# Patient Record
Sex: Male | Born: 1940 | Race: White | Hispanic: No | Marital: Married | Smoking: Never smoker
Health system: Southern US, Community
[De-identification: ages and names within clinical notes are randomized; demographics above are authoritative.]

## PROBLEM LIST (undated history)

## (undated) DIAGNOSIS — E039 Hypothyroidism, unspecified: Secondary | ICD-10-CM

## (undated) DIAGNOSIS — N289 Disorder of kidney and ureter, unspecified: Secondary | ICD-10-CM

## (undated) DIAGNOSIS — C8590 Non-Hodgkin lymphoma, unspecified, unspecified site: Secondary | ICD-10-CM

## (undated) DIAGNOSIS — K222 Esophageal obstruction: Secondary | ICD-10-CM

## (undated) DIAGNOSIS — E876 Hypokalemia: Secondary | ICD-10-CM

## (undated) DIAGNOSIS — E559 Vitamin D deficiency, unspecified: Secondary | ICD-10-CM

## (undated) DIAGNOSIS — H9190 Unspecified hearing loss, unspecified ear: Secondary | ICD-10-CM

## (undated) DIAGNOSIS — C61 Malignant neoplasm of prostate: Secondary | ICD-10-CM

## (undated) DIAGNOSIS — G629 Polyneuropathy, unspecified: Secondary | ICD-10-CM

## (undated) DIAGNOSIS — L0390 Cellulitis, unspecified: Secondary | ICD-10-CM

## (undated) DIAGNOSIS — R7989 Other specified abnormal findings of blood chemistry: Secondary | ICD-10-CM

## (undated) DIAGNOSIS — E785 Hyperlipidemia, unspecified: Secondary | ICD-10-CM

## (undated) DIAGNOSIS — E2740 Unspecified adrenocortical insufficiency: Secondary | ICD-10-CM

## (undated) DIAGNOSIS — M109 Gout, unspecified: Secondary | ICD-10-CM

## (undated) DIAGNOSIS — C9110 Chronic lymphocytic leukemia of B-cell type not having achieved remission: Secondary | ICD-10-CM

## (undated) DIAGNOSIS — I959 Hypotension, unspecified: Secondary | ICD-10-CM

## (undated) DIAGNOSIS — K8020 Calculus of gallbladder without cholecystitis without obstruction: Secondary | ICD-10-CM

## (undated) DIAGNOSIS — K279 Peptic ulcer, site unspecified, unspecified as acute or chronic, without hemorrhage or perforation: Secondary | ICD-10-CM

## (undated) HISTORY — DX: Peptic ulcer, site unspecified, unspecified as acute or chronic, without hemorrhage or perforation: K27.9

## (undated) HISTORY — DX: Hypotension, unspecified: I95.9

## (undated) HISTORY — DX: Non-Hodgkin lymphoma, unspecified, unspecified site: C85.90

## (undated) HISTORY — DX: Cellulitis, unspecified: L03.90

## (undated) HISTORY — PX: HERNIA REPAIR: SHX51

## (undated) HISTORY — DX: Calculus of gallbladder without cholecystitis without obstruction: K80.20

## (undated) HISTORY — DX: Hypothyroidism, unspecified: E03.9

## (undated) HISTORY — DX: Polyneuropathy, unspecified: G62.9

## (undated) HISTORY — DX: Chronic lymphocytic leukemia of B-cell type not having achieved remission: C91.10

## (undated) HISTORY — DX: Unspecified adrenocortical insufficiency: E27.40

## (undated) HISTORY — DX: Other specified abnormal findings of blood chemistry: R79.89

## (undated) HISTORY — DX: Esophageal obstruction: K22.2

## (undated) HISTORY — DX: Disorder of kidney and ureter, unspecified: N28.9

## (undated) HISTORY — DX: Vitamin D deficiency, unspecified: E55.9

## (undated) HISTORY — DX: Hypokalemia: E87.6

## (undated) HISTORY — DX: Hyperlipidemia, unspecified: E78.5

## (undated) HISTORY — DX: Gout, unspecified: M10.9

## (undated) HISTORY — DX: Malignant neoplasm of prostate: C61

## (undated) HISTORY — DX: Unspecified hearing loss, unspecified ear: H91.90

---

## 1999-08-04 ENCOUNTER — Other Ambulatory Visit: Admission: RE | Admit: 1999-08-04 | Discharge: 1999-08-04 | Payer: Self-pay | Admitting: Oncology

## 2001-12-13 ENCOUNTER — Encounter: Payer: Self-pay | Admitting: Oncology

## 2001-12-13 ENCOUNTER — Ambulatory Visit (HOSPITAL_COMMUNITY): Admission: RE | Admit: 2001-12-13 | Discharge: 2001-12-13 | Payer: Self-pay | Admitting: Oncology

## 2002-01-09 ENCOUNTER — Encounter (INDEPENDENT_AMBULATORY_CARE_PROVIDER_SITE_OTHER): Payer: Self-pay | Admitting: Specialist

## 2002-01-09 ENCOUNTER — Ambulatory Visit (HOSPITAL_BASED_OUTPATIENT_CLINIC_OR_DEPARTMENT_OTHER): Admission: RE | Admit: 2002-01-09 | Discharge: 2002-01-09 | Payer: Self-pay | Admitting: General Surgery

## 2002-01-09 ENCOUNTER — Encounter (INDEPENDENT_AMBULATORY_CARE_PROVIDER_SITE_OTHER): Payer: Self-pay

## 2002-01-11 ENCOUNTER — Inpatient Hospital Stay (HOSPITAL_COMMUNITY): Admission: RE | Admit: 2002-01-11 | Discharge: 2002-01-14 | Payer: Self-pay | Admitting: Oncology

## 2002-04-15 ENCOUNTER — Inpatient Hospital Stay (HOSPITAL_COMMUNITY): Admission: EM | Admit: 2002-04-15 | Discharge: 2002-04-18 | Payer: Self-pay | Admitting: Oncology

## 2002-04-15 ENCOUNTER — Encounter: Payer: Self-pay | Admitting: Oncology

## 2002-04-24 ENCOUNTER — Encounter (HOSPITAL_COMMUNITY): Admission: RE | Admit: 2002-04-24 | Discharge: 2002-04-24 | Payer: Self-pay | Admitting: Oncology

## 2002-07-04 ENCOUNTER — Inpatient Hospital Stay (HOSPITAL_COMMUNITY): Admission: EM | Admit: 2002-07-04 | Discharge: 2002-07-07 | Payer: Self-pay | Admitting: Hematology and Oncology

## 2002-07-04 ENCOUNTER — Encounter: Payer: Self-pay | Admitting: Hematology and Oncology

## 2002-07-09 ENCOUNTER — Inpatient Hospital Stay (HOSPITAL_COMMUNITY): Admission: EM | Admit: 2002-07-09 | Discharge: 2002-07-14 | Payer: Self-pay | Admitting: Emergency Medicine

## 2002-07-10 ENCOUNTER — Encounter: Payer: Self-pay | Admitting: Cardiology

## 2002-07-16 ENCOUNTER — Inpatient Hospital Stay (HOSPITAL_COMMUNITY): Admission: EM | Admit: 2002-07-16 | Discharge: 2002-07-24 | Payer: Self-pay | Admitting: Oncology

## 2002-07-17 ENCOUNTER — Encounter: Payer: Self-pay | Admitting: Oncology

## 2002-07-17 ENCOUNTER — Encounter (INDEPENDENT_AMBULATORY_CARE_PROVIDER_SITE_OTHER): Payer: Self-pay | Admitting: Specialist

## 2002-07-18 ENCOUNTER — Encounter: Payer: Self-pay | Admitting: Hematology and Oncology

## 2002-07-20 ENCOUNTER — Encounter: Payer: Self-pay | Admitting: Oncology

## 2003-05-27 ENCOUNTER — Encounter (INDEPENDENT_AMBULATORY_CARE_PROVIDER_SITE_OTHER): Payer: Self-pay | Admitting: *Deleted

## 2003-05-27 ENCOUNTER — Other Ambulatory Visit: Admission: RE | Admit: 2003-05-27 | Discharge: 2003-05-27 | Payer: Self-pay | Admitting: Oncology

## 2003-05-28 ENCOUNTER — Other Ambulatory Visit: Admission: RE | Admit: 2003-05-28 | Discharge: 2003-05-28 | Payer: Self-pay | Admitting: Oncology

## 2004-01-07 ENCOUNTER — Other Ambulatory Visit: Admission: RE | Admit: 2004-01-07 | Discharge: 2004-01-07 | Payer: Self-pay | Admitting: *Deleted

## 2004-08-09 ENCOUNTER — Ambulatory Visit: Payer: Self-pay | Admitting: Oncology

## 2004-09-30 ENCOUNTER — Ambulatory Visit: Payer: Self-pay | Admitting: Oncology

## 2004-12-20 ENCOUNTER — Ambulatory Visit: Payer: Self-pay | Admitting: Oncology

## 2004-12-21 ENCOUNTER — Other Ambulatory Visit: Admission: RE | Admit: 2004-12-21 | Discharge: 2004-12-21 | Payer: Self-pay | Admitting: Oncology

## 2005-02-14 ENCOUNTER — Ambulatory Visit: Payer: Self-pay | Admitting: Oncology

## 2005-04-12 ENCOUNTER — Ambulatory Visit: Payer: Self-pay | Admitting: Oncology

## 2005-08-04 ENCOUNTER — Ambulatory Visit: Payer: Self-pay | Admitting: Oncology

## 2006-01-17 ENCOUNTER — Ambulatory Visit: Payer: Self-pay | Admitting: Oncology

## 2006-02-22 ENCOUNTER — Ambulatory Visit: Payer: Self-pay | Admitting: Oncology

## 2006-02-27 ENCOUNTER — Ambulatory Visit (HOSPITAL_COMMUNITY): Admission: RE | Admit: 2006-02-27 | Discharge: 2006-02-27 | Payer: Self-pay | Admitting: Oncology

## 2006-08-16 ENCOUNTER — Ambulatory Visit: Payer: Self-pay | Admitting: Oncology

## 2007-01-31 ENCOUNTER — Ambulatory Visit: Payer: Self-pay | Admitting: Oncology

## 2007-03-04 ENCOUNTER — Ambulatory Visit (HOSPITAL_COMMUNITY): Admission: RE | Admit: 2007-03-04 | Discharge: 2007-03-04 | Payer: Self-pay | Admitting: Oncology

## 2008-08-09 IMAGING — PT NM PET TUM IMG SKULL BASE T - THIGH
1 of 7 series · 1 of 25 positions shown · non-contrast
Comparison: Head CT 02/27/06.

CLINICAL DATA: Non-Hodgkin's lymphoma.  Status post stem cell transplant 05/28/02.
 FDG PET-CT TUMOR IMAGING (SKULL BASE TO THIGHS) ? 03/04/07: 
 Fasting Blood Glucose:  104.
TECHNIQUE: 16.1 mCi F18-FDG were administered via the left antecubital fossa.  Full ring PET imaging was performed from the skull base through the mid-thighs 60 minutes after injection.  CT data was obtained and used for attenuation correction and anatomic localization only.  (This was not acquired as a diagnostic CT examination.)

[Series 1: pet ac · axial · 3.3mm · 4.69mm/px · 1 of 267 slices shown]
[im 134/267]
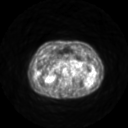

[1 of 25 positions shown; findings below may reference images not displayed]

FINDINGS: There is no hypermetabolic nodal activity in the neck, chest, abdomen, or pelvis.  The abdominal parenchymal organs appear normal.  Specifically, the spleen is normal in size and shows normal metabolic activity.  
 There is physiologic activity within the lymphoid tissue in Waldeyer's ring and within the salivary glands.  There is slightly heterogeneous activity within the thyroid gland with a new area of increased activity inferiorly on the right. This demonstrates a maximal SUV of 4.7.  There is no obvious correlative finding on noncontrast CT imaging.  
 The CT images demonstrate a calcified gallstone.  There is dependent atelectasis in both lungs.  No enlarged abdominal lymph nodes are identified.
IMPRESSION: 1.  No evidence for recurrent lymphoma.  Specifically, there is no hypermetabolic nodal activity or splenic pathology.
 2.  New mildly heterogeneous thyroid activity is nonspecific and possibly physiologic.  However, correlation with thyroid ultrasound is recommended.  If there is a focal abnormality in this area on ultrasound, fine needle aspiration may be warranted.

## 2011-02-03 NOTE — Discharge Summary (Signed)
NAMEJAZE, Todd Todd                          ACCOUNT NO.:  [id number]   MEDICAL RECORD NO.:  [id number]                   PATIENT TYPE:  INP   LOCATION:  0283                                 FACILITY:  [facility]   PHYSICIAN:  Leighton Roach. Truett Perna, M.D.              DATE OF BIRTH:  1940/10/07   DATE OF ADMISSION:  07/16/2002  DATE OF DISCHARGE:  07/24/2002                                 DISCHARGE SUMMARY   CONDITION ON DISCHARGE:  Improved.   DIAGNOSES:  1. Admission with symptomatic orthostatic hypotension, improved at     discharge.  2. Depression.     A. Started on Remeron/Effexor during this hospital admission, with        clinical improvement.  3. 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     A. Status post Cytoxan/total-body irradiation conditioning followed by        autologous stem cell infusion on May 28, 2002.     B. Restaging CT scans and bone marrow biopsy during this hospital        admission without evidence for progressive lymphoma.  4. Persistent anemia/severe thrombocytopenia.     A. Question secondary to bone marrow toxicity from chemotherapy/radiation        versus thrombotic thrombocytopenic purpura.     B. Trial of steroids started prior to discharge.  5. Oral candidiasis, maintained on Diflucan at discharge.  6. Coagulase-negative Staphylococcus, positive urine culture July 21, 2002, maintained on Macrobid at discharge.  7. Persistent hypokalemia, to continue on potassium supplementation.   CONSULTATIONS:  Dr. Jeanie Sewer, psychiatry.   PROCEDURES:  1. Transfusion with packed red blood cells.  2. Bone marrow biopsy.  3. CT scans of the chest, abdomen, and pelvis.   HOSPITAL COURSE:  The patient is a 70 year old with a history of high-grade  non-Hodgkin's lymphoma that evolved from chronic lymphocytic leukemia.   He underwent high-dose Cytoxan/TBI conditioning prior to autologous stem  cell  support on May 28, 2002, at [place].  Since discharge from the [facility] he has been symptomatic with depression, anorexia, and  orthostatic hypotension.   He has undergone an extensive diagnostic workup for the orthostasis without  a specific diagnosis being made.  He is again admitted with hypotension  after having falls at home on July 16, 2002.   He was placed on intravenous hydration, and he was transfused with two units  of packed red cells, and his symptoms again improved during this hospital  admission.  He continued to have orthostatic blood pressure/pulse changes  throughout this admission, but he was ambulating without falls or dizziness  prior to discharge.   The patient was tearful and appeared depressed on admission.  A psychiatry  consult was obtained by Dr. 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  He recommended Remeron/Effexor,  and these were started  prior to discharge.  The patient's affect had improved significantly at the  time of discharge.   He had persistent severe anemia/thrombocytopenia during this admission.  A  repeat bone marrow aspirate and biopsy on July 17, 2002, ([id number])  revealed a normocellular marrow with slight megakaryocytic hypoplasia.  There was no evidence for lymphoma.   The LDH was at the upper end of normal, but there was no other evidence for  hemolysis.  A review of a peripheral blood smear on two occasions during  this admission did not reveal a significant number of schistocytes.  I  discussed the case by telephone on several occasions with Dr. Sherene Sires, and  we decided to begin a trial of steroids on July 21, 2002.  This will be  continued at discharge.  The platelet count was stable at approximately 10-  15,000 throughout this hospital admission.  He was transfused with two units  of packed red cells on July 18, 2002, when hemoglobin returned at 8.3.  Hemoglobin was measured at 9.5  with a platelet count of 10,000 on the day of  discharge.   He was treated with Diflucan for oral candidiasis, and this will be  continued at discharge.   He continued to receive potassium supplementation during this admission, and  this will also be continued at discharge.   Despite the negative hemolysis workup, I am concerned that he may have  posttransplant TTP.  Dr. Sherene Sires will evaluate him at [facility] on July 28, 2002, to consider the indication for phoresis.   On the morning of July 24, 2002, he appeared stable for discharge.   His appetite has improved significantly prior to discharge.   DISCHARGE MEDICATIONS:  1. Effexor 37.5 mg q.d.  2. Remeron 15 mg q.h.s.  3. Macrobid 1 b.i.d. for five days.  4. Ativan 0.5 mg prior to meals as needed for nausea.  5. Prednisone 60 mg q.d.  6. Diflucan 50 mg q.d. for four days.  7. Potassium chloride 20 mEq twice daily.    DISCHARGE INSTRUCTIONS:  Follow-up care will be at the [facility] on July 25, 2002, for a catheter flush.  He will call for fever  greater than 101 degrees, bleeding, or recurrent dizziness/falling episodes.  He will see Dr. Sherene Sires on July 28, 2002, at the [facility].  A follow-up appointment with Dr. Truett Perna will be arranged at the [facility].                                                Leighton Roach Truett Perna, M.D.    GBS/MEDQ  D:  07/24/2002  T:  07/24/2002  Job:  [id number]   cc:   Todd Todd, M.D.  B[facility] Trans[facility]  [facility]   Antonietta Breach, M.D.  [address]  [place], [place] [address]  Fax: [phone]
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
## 2011-02-03 NOTE — H&P (Signed)
Todd Todd, Todd Todd                          ACCOUNT NO.:  [id number]   MEDICAL RECORD NO.:  [id number]                   PATIENT TYPE:  INP   LOCATION:  0259                                 FACILITY:  [facility]   PHYSICIAN:  Lowell C. Catha Gosselin, M.D.               DATE OF BIRTH:  1941/07/13   DATE OF ADMISSION:  07/16/2002  DATE OF DISCHARGE:  07/14/2002                                HISTORY & PHYSICAL   CHIEF COMPLAINT:  Falls and hypotension.   HISTORY OF PRESENT ILLNESS:  Mr. Bennis is a 70 year old gentleman who was  diagnosed with CLL approximately two years ago. In the spring of 2003, he  developed increased adenopathy and leukocytosis. He was treated with Fludara  with progression in the lymphadenopathy and B symptoms. 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 Flow cytometry confirmed CD 20 positivity. He  was treated with three cycles of CHOP/Rituxan with near resolution of the  adenopathy. He was subsequently referred to [facility]  with recommendations for high dose chemotherapy followed by autologous stem  cell transplant. He was treated with Rituxan/DAHP in late July of 2003 at  [facility]. He was subsequently to [facility] on September 2 of 2003,  received high dose chemotherapy/TBI followed by stem cell infusion on  May 28, 2002. His course was complicated by persistent nausea,  vomiting and diarrhea. He was discharged on June 29, 2002 and seen by Dr.  Truett Perna on July 01, 2002 at the [facility]. At that time, he  was found to be hypotensive with symptomatic orthostasis. He has required  admission for hypotension and falls on two occasions (July 04, 2002 to  July 07, 2002 and July 10, 2002 to July 14, 2002).  Workup thus far  has been negative for the etiology of his hypotension including a negative  echo, negative neurological  evaluation and normal cortisol levels. He was  transfused 2 units of packed red blood cells during his last admission for a  hemoglobin of 7.6 and was started on a trial of Florinef. At the time of his  discharge on July 14, 2002, his blood pressure had stabilized and he was  ambulating without difficulty.   Mr. Boulay was seen at the [facility] on July 15, 2002 at  which time he was again found to be hypotensive felt to be secondary to  dehydration. He received a liter of IV fluids in the office. Today, Mr.  Flax presented to the office with complaints of falling at home and was  again found to be hypotensive. He was transferred to [facility]  for admission/further evaluation.   PAST MEDICAL HISTORY:  1. Non-Hodgkin lymphoma as above.  2. Orthostatic hypotension of unknown etiology.  3. Hypokalemia and hypomagnesemia.  4. Klebsiella UTI, October 2003.  5. Anemia and thrombocytopenia likely secondary to high dose therapy.  6. Depression.   CURRENT MEDICATIONS:  1. Cipro 500 mg twice daily.  2. Florinef 0.2 mg daily.  3. K-Dur 20 mEq daily.  4. Magnesium oxide 400 mg b.i.d.  5. Ativan 0.5 mg t.i.d.  6. Protonix 40 mg daily.   ALLERGIES:  No known drug allergies.   FAMILY HISTORY:  Noncontributory.   SOCIAL HISTORY:  Mr. Homeyer lives in [place]. He is married. He owns a  Electrical engineer business.   REVIEW OF SYMPTOMS:  The patient denies any fever or sweats. He reports  increasing weakness over the past two days. He denies any pain. His wife  reports that his oral intake has been fair. He has had no unusual headaches  or vision changes. He denies any bleeding. He has had no shortness of breath  or cough. He denies any change in his bowel habits. He has had no hematuria  or dysuria. He reports chronic right lower extremity weakness. He reports  numbness in his hands and feet.   PHYSICAL EXAMINATION:  VITAL SIGNS:  Temperature 97.5, heart rate 122,   respirations 20, blood pressure 89/54, oxygen saturation 99% on room air.  GENERAL:  Chronically ill appearing male, tearful at times, in no acute  distress.  HEENT:  Alopecia. Pupils equal round and reactive to light. __________  intact, sclera anicteric. Right eyelid ptosis. Right posterior palate with  small petechia.  LYMPH:  No cervical or supraclavicular adenopathy.  CHEST:  Breath sounds are diminished globally; clear. No wheezes or rales.  CARDIOVASCULAR:  S1, tachycardic.  ABDOMEN:  Soft, nontender. No hepatosplenomegaly.  EXTREMITIES:  No edema.  NEUROLOGIC:  Alert and oriented. Tearful at times. Motor strength is 5/5  except for slightly decreased in the right lower extremity. He ambulates  from the wheelchair to the bed without difficulty.   LABORATORY DATA:  Pending.   IMPRESSION AND PLAN:  Mr. Salvino is a 70 year old gentleman with a history of  non-Hodgkin lymphoma status post stem cell therapy a little over six weeks  ago now with his third admission for failure to thrive, hypotension and near  syncope. He has no B symptoms. MRI of the brain on July 04, 2002 was  negative; echocardiogram on July 10, 2002 was unremarkable. We feel that  the best thing we can do with his situation is to do a bone marrow exam on  July 17, 2002 with cytogenetics, flow cytometry and fungal stain/culture.  If the bone marrow exam is negative, we would consider proceeding with an LP  with platelets infusing under Fluoro guidance. If this is  negative, we would then obtain neurology and endocrinology consults. He has  not had a bone marrow exam since his transplant.   The patient was seen and examined by Dr. Catha Gosselin.     Lonna Cobb, N.P.                         Quintin Alto C. Catha Gosselin, M.D.    LT/MEDQ  D:  07/16/2002  T:  07/16/2002  Job:  [id number]   cc:   Jillyn Hidden B. Truett Perna, M.D.  [address] [facility]  [place]  [place]  [phone]  Fax: [phone]

## 2011-02-03 NOTE — Op Note (Signed)
[place]  Patient:    Todd Todd, Todd Todd Visit Number: [id number] MRN: [id number]          Service Type: DSU Location: [facility] Attending Physician:  Arlis Porta Dictated by:   Adolph Pollack, M.D. Proc. Date: 01/09/02 Admit Date:  01/09/2002   CC:         Jillyn Hidden B. Truett Perna, M.D.   Operative Report  PREOPERATIVE DIAGNOSIS:  Chronic lymphocytic leukemia with lymphadenopathy.  POSTOPERATIVE DIAGNOSIS:  Chronic lymphocytic leukemia with lymphadenopathy.  OPERATION PERFORMED:  Deep right axillary lymph node biopsy.  SURGEON:  Adolph Pollack, M.D.  ASSISTANT:  Yaakov Guthrie, PA student.  ANESTHESIA:  Local (1% lidocaine with epinephrine plus 0.5% plain Marcaine) with MAC.  INDICATIONS FOR PROCEDURE:  Mr. Hitchens is a 70 year old male with CLL and now progressive lymphadenopathy.  He presents for lymph node biopsy.  The procedure and the risks were explained to him preoperatively.  DESCRIPTION OF PROCEDURE:  The patient was brought to the operating table, placed supine on the table and given intravenous sedation.  The right axillary area was shaved and sterilely prepped and draped.  Local anesthetic was infiltrated in the right axillary area and a curvilinear incision was made at the inferior aspect of the hairline and carried through the subcutaneous tissue.  The clavipectoral fascia was incised and deep to it were large bulky lymph nodes.  I performed multiple incisional biopsies of the large bulky lymph nodes and sent these fresh to pathology for lymphoma type work-up.  Next, I controlled bleeding with electrocautery and placed a piece of Surgicel on the raw surface of the incised lymph node.  I then closed the clavipectoral fascia with a running 3-0 suture and closed the skin with 3-0 Monocryl subcuticular stitch.  Steri-Strips and sterile dressings were applied.  The patient tolerated the procedure well without any apparent  complications and was taken to the recovery room in satisfactory condition.  An instruction sheet and a prescription for Vicodin will be provided to him.  I will see him back in the office in about one to two weeks for a wound check or sooner if needed. Dictated by:   Adolph Pollack, M.D. Attending Physician:  Arlis Porta DD:  01/09/02 TD:  01/09/02 Job: [id number]

## 2014-10-15 DIAGNOSIS — C9111 Chronic lymphocytic leukemia of B-cell type in remission: Secondary | ICD-10-CM | POA: Diagnosis not present

## 2014-10-15 DIAGNOSIS — C61 Malignant neoplasm of prostate: Secondary | ICD-10-CM | POA: Diagnosis not present

## 2014-10-15 DIAGNOSIS — Z8572 Personal history of non-Hodgkin lymphomas: Secondary | ICD-10-CM | POA: Diagnosis not present

## 2014-12-14 DIAGNOSIS — D509 Iron deficiency anemia, unspecified: Secondary | ICD-10-CM | POA: Diagnosis not present

## 2014-12-14 DIAGNOSIS — N184 Chronic kidney disease, stage 4 (severe): Secondary | ICD-10-CM | POA: Diagnosis not present

## 2014-12-14 DIAGNOSIS — N2581 Secondary hyperparathyroidism of renal origin: Secondary | ICD-10-CM | POA: Diagnosis not present

## 2014-12-16 DIAGNOSIS — N183 Chronic kidney disease, stage 3 (moderate): Secondary | ICD-10-CM | POA: Diagnosis not present

## 2014-12-16 DIAGNOSIS — E559 Vitamin D deficiency, unspecified: Secondary | ICD-10-CM | POA: Diagnosis not present

## 2014-12-16 DIAGNOSIS — E2740 Unspecified adrenocortical insufficiency: Secondary | ICD-10-CM | POA: Diagnosis not present

## 2014-12-16 DIAGNOSIS — C9110 Chronic lymphocytic leukemia of B-cell type not having achieved remission: Secondary | ICD-10-CM | POA: Diagnosis not present

## 2015-01-18 DIAGNOSIS — M109 Gout, unspecified: Secondary | ICD-10-CM | POA: Diagnosis not present

## 2015-01-18 DIAGNOSIS — Z6825 Body mass index (BMI) 25.0-25.9, adult: Secondary | ICD-10-CM | POA: Diagnosis not present

## 2015-01-18 DIAGNOSIS — E039 Hypothyroidism, unspecified: Secondary | ICD-10-CM | POA: Diagnosis not present

## 2015-01-18 DIAGNOSIS — R220 Localized swelling, mass and lump, head: Secondary | ICD-10-CM | POA: Diagnosis not present

## 2015-01-18 DIAGNOSIS — J019 Acute sinusitis, unspecified: Secondary | ICD-10-CM | POA: Diagnosis not present

## 2015-01-20 DIAGNOSIS — D170 Benign lipomatous neoplasm of skin and subcutaneous tissue of head, face and neck: Secondary | ICD-10-CM | POA: Diagnosis not present

## 2015-01-20 DIAGNOSIS — M6740 Ganglion, unspecified site: Secondary | ICD-10-CM | POA: Diagnosis not present

## 2015-01-27 ENCOUNTER — Other Ambulatory Visit: Payer: Self-pay

## 2015-01-27 DIAGNOSIS — M6740 Ganglion, unspecified site: Secondary | ICD-10-CM | POA: Diagnosis not present

## 2015-01-27 DIAGNOSIS — E039 Hypothyroidism, unspecified: Secondary | ICD-10-CM | POA: Diagnosis not present

## 2015-01-27 DIAGNOSIS — L7211 Pilar cyst: Secondary | ICD-10-CM | POA: Diagnosis not present

## 2015-01-27 DIAGNOSIS — K219 Gastro-esophageal reflux disease without esophagitis: Secondary | ICD-10-CM | POA: Diagnosis not present

## 2015-01-27 DIAGNOSIS — D170 Benign lipomatous neoplasm of skin and subcutaneous tissue of head, face and neck: Secondary | ICD-10-CM | POA: Diagnosis not present

## 2015-01-27 DIAGNOSIS — Z8572 Personal history of non-Hodgkin lymphomas: Secondary | ICD-10-CM | POA: Diagnosis not present

## 2015-01-27 DIAGNOSIS — Z79899 Other long term (current) drug therapy: Secondary | ICD-10-CM | POA: Diagnosis not present

## 2015-01-27 DIAGNOSIS — N189 Chronic kidney disease, unspecified: Secondary | ICD-10-CM | POA: Diagnosis not present

## 2015-01-27 DIAGNOSIS — D210 Benign neoplasm of connective and other soft tissue of head, face and neck: Secondary | ICD-10-CM | POA: Diagnosis not present

## 2015-01-27 DIAGNOSIS — M67442 Ganglion, left hand: Secondary | ICD-10-CM | POA: Diagnosis not present

## 2015-02-19 DIAGNOSIS — D509 Iron deficiency anemia, unspecified: Secondary | ICD-10-CM | POA: Diagnosis not present

## 2015-02-19 DIAGNOSIS — N184 Chronic kidney disease, stage 4 (severe): Secondary | ICD-10-CM | POA: Diagnosis not present

## 2015-02-23 DIAGNOSIS — C61 Malignant neoplasm of prostate: Secondary | ICD-10-CM | POA: Diagnosis not present

## 2015-02-23 DIAGNOSIS — E273 Drug-induced adrenocortical insufficiency: Secondary | ICD-10-CM | POA: Diagnosis not present

## 2015-02-23 DIAGNOSIS — Z8572 Personal history of non-Hodgkin lymphomas: Secondary | ICD-10-CM | POA: Diagnosis not present

## 2015-02-23 DIAGNOSIS — T451X5S Adverse effect of antineoplastic and immunosuppressive drugs, sequela: Secondary | ICD-10-CM | POA: Diagnosis not present

## 2015-03-08 DIAGNOSIS — R351 Nocturia: Secondary | ICD-10-CM | POA: Diagnosis not present

## 2015-03-08 DIAGNOSIS — C61 Malignant neoplasm of prostate: Secondary | ICD-10-CM | POA: Diagnosis not present

## 2015-03-11 DIAGNOSIS — E860 Dehydration: Secondary | ICD-10-CM | POA: Diagnosis not present

## 2015-03-11 DIAGNOSIS — T446X5A Adverse effect of alpha-adrenoreceptor antagonists, initial encounter: Secondary | ICD-10-CM | POA: Diagnosis not present

## 2015-03-11 DIAGNOSIS — R079 Chest pain, unspecified: Secondary | ICD-10-CM | POA: Diagnosis not present

## 2015-03-11 DIAGNOSIS — N189 Chronic kidney disease, unspecified: Secondary | ICD-10-CM | POA: Diagnosis not present

## 2015-03-11 DIAGNOSIS — C61 Malignant neoplasm of prostate: Secondary | ICD-10-CM | POA: Diagnosis not present

## 2015-03-11 DIAGNOSIS — Z79899 Other long term (current) drug therapy: Secondary | ICD-10-CM | POA: Diagnosis not present

## 2015-03-11 DIAGNOSIS — S0990XA Unspecified injury of head, initial encounter: Secondary | ICD-10-CM | POA: Diagnosis not present

## 2015-03-11 DIAGNOSIS — N179 Acute kidney failure, unspecified: Secondary | ICD-10-CM | POA: Diagnosis not present

## 2015-03-11 DIAGNOSIS — R55 Syncope and collapse: Secondary | ICD-10-CM | POA: Diagnosis not present

## 2015-03-11 DIAGNOSIS — E2740 Unspecified adrenocortical insufficiency: Secondary | ICD-10-CM | POA: Diagnosis not present

## 2015-03-11 DIAGNOSIS — D72829 Elevated white blood cell count, unspecified: Secondary | ICD-10-CM | POA: Diagnosis not present

## 2015-03-11 DIAGNOSIS — R112 Nausea with vomiting, unspecified: Secondary | ICD-10-CM | POA: Diagnosis not present

## 2015-03-11 DIAGNOSIS — R404 Transient alteration of awareness: Secondary | ICD-10-CM | POA: Diagnosis not present

## 2015-03-11 DIAGNOSIS — Z8249 Family history of ischemic heart disease and other diseases of the circulatory system: Secondary | ICD-10-CM | POA: Diagnosis not present

## 2015-03-11 DIAGNOSIS — I959 Hypotension, unspecified: Secondary | ICD-10-CM | POA: Diagnosis not present

## 2015-03-12 DIAGNOSIS — R079 Chest pain, unspecified: Secondary | ICD-10-CM | POA: Diagnosis not present

## 2015-03-15 DIAGNOSIS — R079 Chest pain, unspecified: Secondary | ICD-10-CM | POA: Diagnosis not present

## 2015-03-15 DIAGNOSIS — Z6825 Body mass index (BMI) 25.0-25.9, adult: Secondary | ICD-10-CM | POA: Diagnosis not present

## 2015-03-15 DIAGNOSIS — E785 Hyperlipidemia, unspecified: Secondary | ICD-10-CM | POA: Diagnosis not present

## 2015-03-15 DIAGNOSIS — I951 Orthostatic hypotension: Secondary | ICD-10-CM | POA: Diagnosis not present

## 2015-03-30 DIAGNOSIS — Z6824 Body mass index (BMI) 24.0-24.9, adult: Secondary | ICD-10-CM | POA: Diagnosis not present

## 2015-03-30 DIAGNOSIS — M109 Gout, unspecified: Secondary | ICD-10-CM | POA: Diagnosis not present

## 2015-04-21 DIAGNOSIS — D609 Acquired pure red cell aplasia, unspecified: Secondary | ICD-10-CM | POA: Diagnosis not present

## 2015-04-21 DIAGNOSIS — D509 Iron deficiency anemia, unspecified: Secondary | ICD-10-CM | POA: Diagnosis not present

## 2015-04-21 DIAGNOSIS — N184 Chronic kidney disease, stage 4 (severe): Secondary | ICD-10-CM | POA: Diagnosis not present

## 2015-04-21 DIAGNOSIS — N2581 Secondary hyperparathyroidism of renal origin: Secondary | ICD-10-CM | POA: Diagnosis not present

## 2015-04-27 DIAGNOSIS — Z139 Encounter for screening, unspecified: Secondary | ICD-10-CM | POA: Diagnosis not present

## 2015-04-27 DIAGNOSIS — N189 Chronic kidney disease, unspecified: Secondary | ICD-10-CM | POA: Diagnosis not present

## 2015-04-27 DIAGNOSIS — I951 Orthostatic hypotension: Secondary | ICD-10-CM | POA: Diagnosis not present

## 2015-04-27 DIAGNOSIS — M109 Gout, unspecified: Secondary | ICD-10-CM | POA: Diagnosis not present

## 2015-04-27 DIAGNOSIS — N184 Chronic kidney disease, stage 4 (severe): Secondary | ICD-10-CM | POA: Diagnosis not present

## 2015-04-27 DIAGNOSIS — Z1389 Encounter for screening for other disorder: Secondary | ICD-10-CM | POA: Diagnosis not present

## 2015-04-27 DIAGNOSIS — Z9181 History of falling: Secondary | ICD-10-CM | POA: Diagnosis not present

## 2015-05-17 DIAGNOSIS — M109 Gout, unspecified: Secondary | ICD-10-CM | POA: Diagnosis not present

## 2015-05-17 DIAGNOSIS — E039 Hypothyroidism, unspecified: Secondary | ICD-10-CM | POA: Diagnosis not present

## 2015-05-17 DIAGNOSIS — G63 Polyneuropathy in diseases classified elsewhere: Secondary | ICD-10-CM | POA: Diagnosis not present

## 2015-05-17 DIAGNOSIS — N184 Chronic kidney disease, stage 4 (severe): Secondary | ICD-10-CM | POA: Diagnosis not present

## 2015-05-17 DIAGNOSIS — Z6825 Body mass index (BMI) 25.0-25.9, adult: Secondary | ICD-10-CM | POA: Diagnosis not present

## 2015-05-20 DIAGNOSIS — E2740 Unspecified adrenocortical insufficiency: Secondary | ICD-10-CM | POA: Diagnosis not present

## 2015-05-20 DIAGNOSIS — N183 Chronic kidney disease, stage 3 (moderate): Secondary | ICD-10-CM | POA: Diagnosis not present

## 2015-05-20 DIAGNOSIS — N179 Acute kidney failure, unspecified: Secondary | ICD-10-CM | POA: Diagnosis not present

## 2015-05-20 DIAGNOSIS — E559 Vitamin D deficiency, unspecified: Secondary | ICD-10-CM | POA: Diagnosis not present

## 2015-06-03 DIAGNOSIS — Z6825 Body mass index (BMI) 25.0-25.9, adult: Secondary | ICD-10-CM | POA: Diagnosis not present

## 2015-06-03 DIAGNOSIS — C61 Malignant neoplasm of prostate: Secondary | ICD-10-CM | POA: Diagnosis not present

## 2015-06-08 DIAGNOSIS — C61 Malignant neoplasm of prostate: Secondary | ICD-10-CM | POA: Diagnosis not present

## 2015-06-09 ENCOUNTER — Telehealth: Payer: Self-pay | Admitting: *Deleted

## 2015-06-09 ENCOUNTER — Ambulatory Visit: Payer: Medicare Other | Admitting: Neurology

## 2015-06-09 DIAGNOSIS — Z8572 Personal history of non-Hodgkin lymphomas: Secondary | ICD-10-CM | POA: Diagnosis not present

## 2015-06-09 DIAGNOSIS — C61 Malignant neoplasm of prostate: Secondary | ICD-10-CM | POA: Diagnosis not present

## 2015-06-09 DIAGNOSIS — E039 Hypothyroidism, unspecified: Secondary | ICD-10-CM | POA: Diagnosis not present

## 2015-06-09 DIAGNOSIS — G629 Polyneuropathy, unspecified: Secondary | ICD-10-CM | POA: Diagnosis not present

## 2015-06-09 NOTE — Telephone Encounter (Signed)
Called to cancel new patient appt the morning is was scheduled.

## 2015-06-14 DIAGNOSIS — E041 Nontoxic single thyroid nodule: Secondary | ICD-10-CM | POA: Diagnosis not present

## 2015-06-14 DIAGNOSIS — E079 Disorder of thyroid, unspecified: Secondary | ICD-10-CM | POA: Diagnosis not present

## 2015-06-25 DIAGNOSIS — C61 Malignant neoplasm of prostate: Secondary | ICD-10-CM | POA: Diagnosis not present

## 2015-06-30 ENCOUNTER — Encounter: Payer: Self-pay | Admitting: Neurology

## 2015-06-30 ENCOUNTER — Ambulatory Visit (INDEPENDENT_AMBULATORY_CARE_PROVIDER_SITE_OTHER): Payer: Medicare Other | Admitting: Neurology

## 2015-06-30 VITALS — BP 124/82 | HR 101 | Ht 70.5 in | Wt 181.0 lb

## 2015-06-30 DIAGNOSIS — G622 Polyneuropathy due to other toxic agents: Secondary | ICD-10-CM | POA: Diagnosis not present

## 2015-06-30 DIAGNOSIS — R269 Unspecified abnormalities of gait and mobility: Secondary | ICD-10-CM
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
## 2015-07-02 DIAGNOSIS — R2681 Unsteadiness on feet: Secondary | ICD-10-CM | POA: Diagnosis not present

## 2015-07-02 DIAGNOSIS — M6281 Muscle weakness (generalized): Secondary | ICD-10-CM | POA: Diagnosis not present

## 2015-07-02 DIAGNOSIS — C61 Malignant neoplasm of prostate: Secondary | ICD-10-CM | POA: Diagnosis not present

## 2015-07-02 DIAGNOSIS — R531 Weakness: Secondary | ICD-10-CM | POA: Diagnosis not present

## 2015-07-02 DIAGNOSIS — R201 Hypoesthesia of skin: Secondary | ICD-10-CM | POA: Diagnosis not present

## 2015-07-02 DIAGNOSIS — R2689 Other abnormalities of gait and mobility: Secondary | ICD-10-CM | POA: Diagnosis not present

## 2015-07-07 DIAGNOSIS — R2689 Other abnormalities of gait and mobility: Secondary | ICD-10-CM | POA: Diagnosis not present

## 2015-07-07 DIAGNOSIS — C61 Malignant neoplasm of prostate: Secondary | ICD-10-CM | POA: Diagnosis not present

## 2015-07-07 DIAGNOSIS — R531 Weakness: Secondary | ICD-10-CM | POA: Diagnosis not present

## 2015-07-07 DIAGNOSIS — M6281 Muscle weakness (generalized): Secondary | ICD-10-CM | POA: Diagnosis not present

## 2015-07-07 DIAGNOSIS — R2681 Unsteadiness on feet: Secondary | ICD-10-CM | POA: Diagnosis not present

## 2015-07-07 DIAGNOSIS — R201 Hypoesthesia of skin: Secondary | ICD-10-CM | POA: Diagnosis not present

## 2015-07-08 DIAGNOSIS — Z23 Encounter for immunization: Secondary | ICD-10-CM | POA: Diagnosis not present

## 2015-07-09 DIAGNOSIS — C61 Malignant neoplasm of prostate: Secondary | ICD-10-CM | POA: Diagnosis not present

## 2015-07-09 DIAGNOSIS — R2689 Other abnormalities of gait and mobility: Secondary | ICD-10-CM | POA: Diagnosis not present

## 2015-07-09 DIAGNOSIS — R531 Weakness: Secondary | ICD-10-CM | POA: Diagnosis not present

## 2015-07-09 DIAGNOSIS — R2681 Unsteadiness on feet: Secondary | ICD-10-CM | POA: Diagnosis not present

## 2015-07-09 DIAGNOSIS — M6281 Muscle weakness (generalized): Secondary | ICD-10-CM | POA: Diagnosis not present

## 2015-07-09 DIAGNOSIS — C9111 Chronic lymphocytic leukemia of B-cell type in remission: Secondary | ICD-10-CM | POA: Diagnosis not present

## 2015-07-09 DIAGNOSIS — C8338 Diffuse large B-cell lymphoma, lymph nodes of multiple sites: Secondary | ICD-10-CM | POA: Diagnosis not present

## 2015-07-09 DIAGNOSIS — R201 Hypoesthesia of skin: Secondary | ICD-10-CM | POA: Diagnosis not present

## 2015-07-13 DIAGNOSIS — M6281 Muscle weakness (generalized): Secondary | ICD-10-CM | POA: Diagnosis not present

## 2015-07-13 DIAGNOSIS — C61 Malignant neoplasm of prostate: Secondary | ICD-10-CM | POA: Diagnosis not present

## 2015-07-13 DIAGNOSIS — R531 Weakness: Secondary | ICD-10-CM | POA: Diagnosis not present

## 2015-07-13 DIAGNOSIS — R201 Hypoesthesia of skin: Secondary | ICD-10-CM | POA: Diagnosis not present

## 2015-07-13 DIAGNOSIS — R2689 Other abnormalities of gait and mobility: Secondary | ICD-10-CM | POA: Diagnosis not present

## 2015-07-13 DIAGNOSIS — R2681 Unsteadiness on feet: Secondary | ICD-10-CM | POA: Diagnosis not present

## 2015-07-15 DIAGNOSIS — C61 Malignant neoplasm of prostate: Secondary | ICD-10-CM | POA: Diagnosis not present

## 2015-07-20 DIAGNOSIS — R531 Weakness: Secondary | ICD-10-CM | POA: Diagnosis not present

## 2015-07-20 DIAGNOSIS — R201 Hypoesthesia of skin: Secondary | ICD-10-CM | POA: Diagnosis not present

## 2015-07-20 DIAGNOSIS — R2689 Other abnormalities of gait and mobility: Secondary | ICD-10-CM | POA: Diagnosis not present

## 2015-07-20 DIAGNOSIS — R41841 Cognitive communication deficit: Secondary | ICD-10-CM | POA: Diagnosis not present

## 2015-07-20 DIAGNOSIS — C61 Malignant neoplasm of prostate: Secondary | ICD-10-CM | POA: Diagnosis not present

## 2015-07-20 DIAGNOSIS — R2681 Unsteadiness on feet: Secondary | ICD-10-CM | POA: Diagnosis not present

## 2015-07-20 DIAGNOSIS — M6281 Muscle weakness (generalized): Secondary | ICD-10-CM | POA: Diagnosis not present

## 2015-07-22 DIAGNOSIS — R41841 Cognitive communication deficit: Secondary | ICD-10-CM | POA: Diagnosis not present

## 2015-07-22 DIAGNOSIS — R2681 Unsteadiness on feet: Secondary | ICD-10-CM | POA: Diagnosis not present

## 2015-07-22 DIAGNOSIS — R201 Hypoesthesia of skin: Secondary | ICD-10-CM | POA: Diagnosis not present

## 2015-07-22 DIAGNOSIS — R2689 Other abnormalities of gait and mobility: Secondary | ICD-10-CM | POA: Diagnosis not present

## 2015-07-22 DIAGNOSIS — C61 Malignant neoplasm of prostate: Secondary | ICD-10-CM | POA: Diagnosis not present

## 2015-07-22 DIAGNOSIS — M6281 Muscle weakness (generalized): Secondary | ICD-10-CM | POA: Diagnosis not present

## 2015-07-22 DIAGNOSIS — R531 Weakness: Secondary | ICD-10-CM | POA: Diagnosis not present

## 2015-07-26 DIAGNOSIS — R41841 Cognitive communication deficit: Secondary | ICD-10-CM | POA: Diagnosis not present

## 2015-07-26 DIAGNOSIS — R2689 Other abnormalities of gait and mobility: Secondary | ICD-10-CM | POA: Diagnosis not present

## 2015-07-26 DIAGNOSIS — R531 Weakness: Secondary | ICD-10-CM | POA: Diagnosis not present

## 2015-07-26 DIAGNOSIS — R2681 Unsteadiness on feet: Secondary | ICD-10-CM | POA: Diagnosis not present

## 2015-07-26 DIAGNOSIS — C61 Malignant neoplasm of prostate: Secondary | ICD-10-CM | POA: Diagnosis not present

## 2015-07-26 DIAGNOSIS — R201 Hypoesthesia of skin: Secondary | ICD-10-CM | POA: Diagnosis not present

## 2015-07-26 DIAGNOSIS — M6281 Muscle weakness (generalized): Secondary | ICD-10-CM | POA: Diagnosis not present

## 2015-07-28 DIAGNOSIS — D631 Anemia in chronic kidney disease: Secondary | ICD-10-CM | POA: Diagnosis not present

## 2015-07-28 DIAGNOSIS — C61 Malignant neoplasm of prostate: Secondary | ICD-10-CM | POA: Diagnosis not present

## 2015-07-28 DIAGNOSIS — N184 Chronic kidney disease, stage 4 (severe): Secondary | ICD-10-CM | POA: Diagnosis not present

## 2015-07-28 DIAGNOSIS — C8338 Diffuse large B-cell lymphoma, lymph nodes of multiple sites: Secondary | ICD-10-CM | POA: Diagnosis not present

## 2015-07-29 DIAGNOSIS — R41841 Cognitive communication deficit: Secondary | ICD-10-CM | POA: Diagnosis not present

## 2015-07-29 DIAGNOSIS — C61 Malignant neoplasm of prostate: Secondary | ICD-10-CM | POA: Diagnosis not present

## 2015-07-29 DIAGNOSIS — M6281 Muscle weakness (generalized): Secondary | ICD-10-CM | POA: Diagnosis not present

## 2015-07-29 DIAGNOSIS — R2689 Other abnormalities of gait and mobility: Secondary | ICD-10-CM | POA: Diagnosis not present

## 2015-07-29 DIAGNOSIS — R201 Hypoesthesia of skin: Secondary | ICD-10-CM | POA: Diagnosis not present

## 2015-07-29 DIAGNOSIS — R531 Weakness: Secondary | ICD-10-CM | POA: Diagnosis not present

## 2015-07-29 DIAGNOSIS — R2681 Unsteadiness on feet: Secondary | ICD-10-CM | POA: Diagnosis not present

## 2015-08-02 DIAGNOSIS — C61 Malignant neoplasm of prostate: Secondary | ICD-10-CM | POA: Diagnosis not present

## 2015-08-02 DIAGNOSIS — R201 Hypoesthesia of skin: Secondary | ICD-10-CM | POA: Diagnosis not present

## 2015-08-02 DIAGNOSIS — R41841 Cognitive communication deficit: Secondary | ICD-10-CM | POA: Diagnosis not present

## 2015-08-02 DIAGNOSIS — M6281 Muscle weakness (generalized): Secondary | ICD-10-CM | POA: Diagnosis not present

## 2015-08-02 DIAGNOSIS — R2689 Other abnormalities of gait and mobility: Secondary | ICD-10-CM | POA: Diagnosis not present

## 2015-08-02 DIAGNOSIS — R531 Weakness: Secondary | ICD-10-CM | POA: Diagnosis not present

## 2015-08-02 DIAGNOSIS — R2681 Unsteadiness on feet: Secondary | ICD-10-CM | POA: Diagnosis not present

## 2015-08-05 DIAGNOSIS — R2689 Other abnormalities of gait and mobility: Secondary | ICD-10-CM | POA: Diagnosis not present

## 2015-08-05 DIAGNOSIS — R531 Weakness: Secondary | ICD-10-CM | POA: Diagnosis not present

## 2015-08-05 DIAGNOSIS — R201 Hypoesthesia of skin: Secondary | ICD-10-CM | POA: Diagnosis not present

## 2015-08-05 DIAGNOSIS — R41841 Cognitive communication deficit: Secondary | ICD-10-CM | POA: Diagnosis not present

## 2015-08-05 DIAGNOSIS — M6281 Muscle weakness (generalized): Secondary | ICD-10-CM | POA: Diagnosis not present

## 2015-08-05 DIAGNOSIS — C61 Malignant neoplasm of prostate: Secondary | ICD-10-CM | POA: Diagnosis not present

## 2015-08-05 DIAGNOSIS — R2681 Unsteadiness on feet: Secondary | ICD-10-CM | POA: Diagnosis not present

## 2015-08-09 DIAGNOSIS — M6281 Muscle weakness (generalized): Secondary | ICD-10-CM | POA: Diagnosis not present

## 2015-08-09 DIAGNOSIS — C61 Malignant neoplasm of prostate: Secondary | ICD-10-CM | POA: Diagnosis not present

## 2015-08-09 DIAGNOSIS — R2689 Other abnormalities of gait and mobility: Secondary | ICD-10-CM | POA: Diagnosis not present

## 2015-08-09 DIAGNOSIS — R2681 Unsteadiness on feet: Secondary | ICD-10-CM | POA: Diagnosis not present

## 2015-08-09 DIAGNOSIS — R201 Hypoesthesia of skin: Secondary | ICD-10-CM | POA: Diagnosis not present

## 2015-08-09 DIAGNOSIS — R531 Weakness: Secondary | ICD-10-CM | POA: Diagnosis not present

## 2015-08-09 DIAGNOSIS — R41841 Cognitive communication deficit: Secondary | ICD-10-CM | POA: Diagnosis not present

## 2015-08-16 DIAGNOSIS — R41841 Cognitive communication deficit: Secondary | ICD-10-CM | POA: Diagnosis not present

## 2015-08-16 DIAGNOSIS — R2681 Unsteadiness on feet: Secondary | ICD-10-CM | POA: Diagnosis not present

## 2015-08-16 DIAGNOSIS — R201 Hypoesthesia of skin: Secondary | ICD-10-CM | POA: Diagnosis not present

## 2015-08-16 DIAGNOSIS — R531 Weakness: Secondary | ICD-10-CM | POA: Diagnosis not present

## 2015-08-16 DIAGNOSIS — C61 Malignant neoplasm of prostate: Secondary | ICD-10-CM | POA: Diagnosis not present

## 2015-08-16 DIAGNOSIS — M6281 Muscle weakness (generalized): Secondary | ICD-10-CM | POA: Diagnosis not present

## 2015-08-16 DIAGNOSIS — R2689 Other abnormalities of gait and mobility: Secondary | ICD-10-CM | POA: Diagnosis not present

## 2015-08-19 DIAGNOSIS — M6281 Muscle weakness (generalized): Secondary | ICD-10-CM | POA: Diagnosis not present

## 2015-08-19 DIAGNOSIS — R531 Weakness: Secondary | ICD-10-CM | POA: Diagnosis not present

## 2015-08-19 DIAGNOSIS — R2689 Other abnormalities of gait and mobility: Secondary | ICD-10-CM | POA: Diagnosis not present

## 2015-08-19 DIAGNOSIS — R201 Hypoesthesia of skin: Secondary | ICD-10-CM | POA: Diagnosis not present

## 2015-08-19 DIAGNOSIS — R2681 Unsteadiness on feet: Secondary | ICD-10-CM | POA: Diagnosis not present

## 2015-08-19 DIAGNOSIS — C61 Malignant neoplasm of prostate: Secondary | ICD-10-CM | POA: Diagnosis not present

## 2015-08-23 DIAGNOSIS — M6281 Muscle weakness (generalized): Secondary | ICD-10-CM | POA: Diagnosis not present

## 2015-08-23 DIAGNOSIS — R201 Hypoesthesia of skin: Secondary | ICD-10-CM | POA: Diagnosis not present

## 2015-08-23 DIAGNOSIS — R2689 Other abnormalities of gait and mobility: Secondary | ICD-10-CM | POA: Diagnosis not present

## 2015-08-23 DIAGNOSIS — C61 Malignant neoplasm of prostate: Secondary | ICD-10-CM | POA: Diagnosis not present

## 2015-08-23 DIAGNOSIS — R531 Weakness: Secondary | ICD-10-CM | POA: Diagnosis not present

## 2015-08-23 DIAGNOSIS — R2681 Unsteadiness on feet: Secondary | ICD-10-CM | POA: Diagnosis not present

## 2015-08-26 DIAGNOSIS — C61 Malignant neoplasm of prostate: Secondary | ICD-10-CM | POA: Diagnosis not present

## 2015-08-26 DIAGNOSIS — R2689 Other abnormalities of gait and mobility: Secondary | ICD-10-CM | POA: Diagnosis not present

## 2015-08-26 DIAGNOSIS — M6281 Muscle weakness (generalized): Secondary | ICD-10-CM | POA: Diagnosis not present

## 2015-08-26 DIAGNOSIS — R2681 Unsteadiness on feet: Secondary | ICD-10-CM | POA: Diagnosis not present

## 2015-08-26 DIAGNOSIS — R531 Weakness: Secondary | ICD-10-CM | POA: Diagnosis not present

## 2015-08-26 DIAGNOSIS — R201 Hypoesthesia of skin: Secondary | ICD-10-CM | POA: Diagnosis not present

## 2015-08-30 DIAGNOSIS — C61 Malignant neoplasm of prostate: Secondary | ICD-10-CM | POA: Diagnosis not present

## 2015-08-30 DIAGNOSIS — R2689 Other abnormalities of gait and mobility: Secondary | ICD-10-CM | POA: Diagnosis not present

## 2015-08-30 DIAGNOSIS — R531 Weakness: Secondary | ICD-10-CM | POA: Diagnosis not present

## 2015-08-30 DIAGNOSIS — R201 Hypoesthesia of skin: Secondary | ICD-10-CM | POA: Diagnosis not present

## 2015-08-30 DIAGNOSIS — R2681 Unsteadiness on feet: Secondary | ICD-10-CM | POA: Diagnosis not present

## 2015-08-30 DIAGNOSIS — M6281 Muscle weakness (generalized): Secondary | ICD-10-CM | POA: Diagnosis not present

## 2015-09-01 ENCOUNTER — Ambulatory Visit: Payer: Medicare Other | Admitting: Neurology

## 2015-09-08 DIAGNOSIS — R2681 Unsteadiness on feet: Secondary | ICD-10-CM | POA: Diagnosis not present

## 2015-09-08 DIAGNOSIS — R2689 Other abnormalities of gait and mobility: Secondary | ICD-10-CM | POA: Diagnosis not present

## 2015-09-08 DIAGNOSIS — R201 Hypoesthesia of skin: Secondary | ICD-10-CM | POA: Diagnosis not present

## 2015-09-08 DIAGNOSIS — C61 Malignant neoplasm of prostate: Secondary | ICD-10-CM | POA: Diagnosis not present

## 2015-09-08 DIAGNOSIS — M6281 Muscle weakness (generalized): Secondary | ICD-10-CM | POA: Diagnosis not present

## 2015-09-08 DIAGNOSIS — R531 Weakness: Secondary | ICD-10-CM | POA: Diagnosis not present

## 2015-09-09 DIAGNOSIS — R233 Spontaneous ecchymoses: Secondary | ICD-10-CM | POA: Diagnosis not present

## 2015-09-09 DIAGNOSIS — D225 Melanocytic nevi of trunk: Secondary | ICD-10-CM | POA: Diagnosis not present

## 2015-09-09 DIAGNOSIS — D1801 Hemangioma of skin and subcutaneous tissue: Secondary | ICD-10-CM | POA: Diagnosis not present

## 2015-09-09 DIAGNOSIS — L821 Other seborrheic keratosis: Secondary | ICD-10-CM | POA: Diagnosis not present

## 2015-09-15 DIAGNOSIS — R201 Hypoesthesia of skin: Secondary | ICD-10-CM | POA: Diagnosis not present

## 2015-09-15 DIAGNOSIS — M6281 Muscle weakness (generalized): Secondary | ICD-10-CM | POA: Diagnosis not present

## 2015-09-15 DIAGNOSIS — R2689 Other abnormalities of gait and mobility: Secondary | ICD-10-CM | POA: Diagnosis not present

## 2015-09-15 DIAGNOSIS — C61 Malignant neoplasm of prostate: Secondary | ICD-10-CM | POA: Diagnosis not present

## 2015-09-15 DIAGNOSIS — R2681 Unsteadiness on feet: Secondary | ICD-10-CM | POA: Diagnosis not present

## 2015-09-15 DIAGNOSIS — R531 Weakness: Secondary | ICD-10-CM | POA: Diagnosis not present

## 2015-10-08 DIAGNOSIS — C61 Malignant neoplasm of prostate: Secondary | ICD-10-CM | POA: Diagnosis not present

## 2015-10-08 DIAGNOSIS — D631 Anemia in chronic kidney disease: Secondary | ICD-10-CM | POA: Diagnosis not present

## 2015-10-08 DIAGNOSIS — C8330 Diffuse large B-cell lymphoma, unspecified site: Secondary | ICD-10-CM | POA: Diagnosis not present

## 2015-10-08 DIAGNOSIS — Z856 Personal history of leukemia: Secondary | ICD-10-CM

## 2015-10-08 DIAGNOSIS — D649 Anemia, unspecified: Secondary | ICD-10-CM | POA: Diagnosis not present

## 2015-10-08 DIAGNOSIS — C8338 Diffuse large B-cell lymphoma, lymph nodes of multiple sites: Secondary | ICD-10-CM | POA: Diagnosis not present

## 2015-10-08 DIAGNOSIS — N184 Chronic kidney disease, stage 4 (severe): Secondary | ICD-10-CM | POA: Diagnosis not present

## 2015-10-08 DIAGNOSIS — I10 Essential (primary) hypertension: Secondary | ICD-10-CM

## 2015-10-08 DIAGNOSIS — E2740 Unspecified adrenocortical insufficiency: Secondary | ICD-10-CM

## 2015-10-08 DIAGNOSIS — E041 Nontoxic single thyroid nodule: Secondary | ICD-10-CM

## 2015-10-08 DIAGNOSIS — G629 Polyneuropathy, unspecified: Secondary | ICD-10-CM

## 2015-11-05 DIAGNOSIS — J101 Influenza due to other identified influenza virus with other respiratory manifestations: Secondary | ICD-10-CM | POA: Diagnosis not present

## 2015-11-05 DIAGNOSIS — R030 Elevated blood-pressure reading, without diagnosis of hypertension: Secondary | ICD-10-CM | POA: Diagnosis not present

## 2015-11-18 DIAGNOSIS — R1084 Generalized abdominal pain: Secondary | ICD-10-CM | POA: Diagnosis not present

## 2015-11-26 DIAGNOSIS — K5909 Other constipation: Secondary | ICD-10-CM | POA: Diagnosis not present

## 2015-11-26 DIAGNOSIS — R1030 Lower abdominal pain, unspecified: Secondary | ICD-10-CM | POA: Diagnosis not present

## 2015-11-26 DIAGNOSIS — E039 Hypothyroidism, unspecified: Secondary | ICD-10-CM | POA: Diagnosis not present

## 2015-11-26 DIAGNOSIS — N183 Chronic kidney disease, stage 3 (moderate): Secondary | ICD-10-CM | POA: Diagnosis not present

## 2015-12-29 DIAGNOSIS — Z6824 Body mass index (BMI) 24.0-24.9, adult: Secondary | ICD-10-CM | POA: Diagnosis not present

## 2015-12-29 DIAGNOSIS — J208 Acute bronchitis due to other specified organisms: Secondary | ICD-10-CM | POA: Diagnosis not present

## 2016-01-26 DIAGNOSIS — I129 Hypertensive chronic kidney disease with stage 1 through stage 4 chronic kidney disease, or unspecified chronic kidney disease: Secondary | ICD-10-CM | POA: Diagnosis not present

## 2016-01-26 DIAGNOSIS — I10 Essential (primary) hypertension: Secondary | ICD-10-CM | POA: Diagnosis not present

## 2016-01-26 DIAGNOSIS — R42 Dizziness and giddiness: Secondary | ICD-10-CM | POA: Diagnosis not present

## 2016-01-26 DIAGNOSIS — N179 Acute kidney failure, unspecified: Secondary | ICD-10-CM | POA: Diagnosis not present

## 2016-01-26 DIAGNOSIS — C61 Malignant neoplasm of prostate: Secondary | ICD-10-CM | POA: Diagnosis not present

## 2016-01-26 DIAGNOSIS — N189 Chronic kidney disease, unspecified: Secondary | ICD-10-CM | POA: Diagnosis not present

## 2016-01-26 DIAGNOSIS — E2740 Unspecified adrenocortical insufficiency: Secondary | ICD-10-CM | POA: Diagnosis not present

## 2016-01-26 DIAGNOSIS — R531 Weakness: Secondary | ICD-10-CM | POA: Diagnosis not present

## 2016-01-26 DIAGNOSIS — N183 Chronic kidney disease, stage 3 (moderate): Secondary | ICD-10-CM | POA: Diagnosis not present

## 2016-01-26 DIAGNOSIS — E559 Vitamin D deficiency, unspecified: Secondary | ICD-10-CM | POA: Diagnosis not present

## 2016-01-28 DIAGNOSIS — I10 Essential (primary) hypertension: Secondary | ICD-10-CM | POA: Diagnosis not present

## 2016-01-28 DIAGNOSIS — Z6824 Body mass index (BMI) 24.0-24.9, adult: Secondary | ICD-10-CM | POA: Diagnosis not present

## 2016-02-04 DIAGNOSIS — C8338 Diffuse large B-cell lymphoma, lymph nodes of multiple sites: Secondary | ICD-10-CM | POA: Diagnosis not present

## 2016-02-04 DIAGNOSIS — D631 Anemia in chronic kidney disease: Secondary | ICD-10-CM | POA: Diagnosis not present

## 2016-02-04 DIAGNOSIS — C61 Malignant neoplasm of prostate: Secondary | ICD-10-CM | POA: Diagnosis not present

## 2016-02-04 DIAGNOSIS — N184 Chronic kidney disease, stage 4 (severe): Secondary | ICD-10-CM | POA: Diagnosis not present

## 2016-02-10 DIAGNOSIS — G4700 Insomnia, unspecified: Secondary | ICD-10-CM | POA: Diagnosis not present

## 2016-02-10 DIAGNOSIS — Z6824 Body mass index (BMI) 24.0-24.9, adult: Secondary | ICD-10-CM | POA: Diagnosis not present

## 2016-02-25 DIAGNOSIS — H6693 Otitis media, unspecified, bilateral: Secondary | ICD-10-CM | POA: Diagnosis not present

## 2016-03-03 DIAGNOSIS — H6693 Otitis media, unspecified, bilateral: Secondary | ICD-10-CM | POA: Diagnosis not present

## 2016-03-03 DIAGNOSIS — Z6824 Body mass index (BMI) 24.0-24.9, adult: Secondary | ICD-10-CM | POA: Diagnosis not present

## 2016-03-14 DIAGNOSIS — H9203 Otalgia, bilateral: Secondary | ICD-10-CM | POA: Diagnosis not present

## 2016-03-14 DIAGNOSIS — H6693 Otitis media, unspecified, bilateral: Secondary | ICD-10-CM | POA: Diagnosis not present

## 2016-03-16 DIAGNOSIS — J181 Lobar pneumonia, unspecified organism: Secondary | ICD-10-CM | POA: Diagnosis not present

## 2016-03-16 DIAGNOSIS — C8590 Non-Hodgkin lymphoma, unspecified, unspecified site: Secondary | ICD-10-CM | POA: Diagnosis not present

## 2016-03-16 DIAGNOSIS — K8020 Calculus of gallbladder without cholecystitis without obstruction: Secondary | ICD-10-CM | POA: Diagnosis not present

## 2016-03-16 DIAGNOSIS — N189 Chronic kidney disease, unspecified: Secondary | ICD-10-CM | POA: Diagnosis not present

## 2016-03-16 DIAGNOSIS — N289 Disorder of kidney and ureter, unspecified: Secondary | ICD-10-CM | POA: Diagnosis not present

## 2016-03-16 DIAGNOSIS — I950 Idiopathic hypotension: Secondary | ICD-10-CM | POA: Diagnosis not present

## 2016-03-16 DIAGNOSIS — C9111 Chronic lymphocytic leukemia of B-cell type in remission: Secondary | ICD-10-CM | POA: Diagnosis not present

## 2016-03-16 DIAGNOSIS — I129 Hypertensive chronic kidney disease with stage 1 through stage 4 chronic kidney disease, or unspecified chronic kidney disease: Secondary | ICD-10-CM | POA: Diagnosis not present

## 2016-03-16 DIAGNOSIS — C61 Malignant neoplasm of prostate: Secondary | ICD-10-CM | POA: Diagnosis not present

## 2016-03-16 DIAGNOSIS — R296 Repeated falls: Secondary | ICD-10-CM | POA: Diagnosis not present

## 2016-03-16 DIAGNOSIS — E860 Dehydration: Secondary | ICD-10-CM | POA: Diagnosis not present

## 2016-03-16 DIAGNOSIS — J189 Pneumonia, unspecified organism: Secondary | ICD-10-CM | POA: Diagnosis not present

## 2016-03-16 DIAGNOSIS — Z79899 Other long term (current) drug therapy: Secondary | ICD-10-CM | POA: Diagnosis not present

## 2016-03-16 DIAGNOSIS — Z9181 History of falling: Secondary | ICD-10-CM | POA: Diagnosis not present

## 2016-03-16 DIAGNOSIS — R531 Weakness: Secondary | ICD-10-CM | POA: Diagnosis not present

## 2016-03-17 DIAGNOSIS — M1000 Idiopathic gout, unspecified site: Secondary | ICD-10-CM | POA: Diagnosis not present

## 2016-03-17 DIAGNOSIS — G4700 Insomnia, unspecified: Secondary | ICD-10-CM | POA: Diagnosis not present

## 2016-03-17 DIAGNOSIS — Z79899 Other long term (current) drug therapy: Secondary | ICD-10-CM | POA: Diagnosis not present

## 2016-03-17 DIAGNOSIS — I10 Essential (primary) hypertension: Secondary | ICD-10-CM | POA: Diagnosis not present

## 2016-03-17 DIAGNOSIS — N183 Chronic kidney disease, stage 3 (moderate): Secondary | ICD-10-CM | POA: Diagnosis not present

## 2016-03-17 DIAGNOSIS — R262 Difficulty in walking, not elsewhere classified: Secondary | ICD-10-CM | POA: Diagnosis not present

## 2016-03-17 DIAGNOSIS — Z9181 History of falling: Secondary | ICD-10-CM | POA: Diagnosis not present

## 2016-03-17 DIAGNOSIS — C61 Malignant neoplasm of prostate: Secondary | ICD-10-CM | POA: Diagnosis not present

## 2016-03-17 DIAGNOSIS — N189 Chronic kidney disease, unspecified: Secondary | ICD-10-CM | POA: Diagnosis not present

## 2016-03-17 DIAGNOSIS — D649 Anemia, unspecified: Secondary | ICD-10-CM | POA: Diagnosis not present

## 2016-03-17 DIAGNOSIS — G894 Chronic pain syndrome: Secondary | ICD-10-CM | POA: Diagnosis not present

## 2016-03-17 DIAGNOSIS — J181 Lobar pneumonia, unspecified organism: Secondary | ICD-10-CM | POA: Diagnosis not present

## 2016-03-17 DIAGNOSIS — C8590 Non-Hodgkin lymphoma, unspecified, unspecified site: Secondary | ICD-10-CM | POA: Diagnosis not present

## 2016-03-17 DIAGNOSIS — J189 Pneumonia, unspecified organism: Secondary | ICD-10-CM | POA: Diagnosis not present

## 2016-03-17 DIAGNOSIS — E039 Hypothyroidism, unspecified: Secondary | ICD-10-CM | POA: Diagnosis not present

## 2016-03-17 DIAGNOSIS — E860 Dehydration: Secondary | ICD-10-CM | POA: Diagnosis not present

## 2016-03-17 DIAGNOSIS — C9111 Chronic lymphocytic leukemia of B-cell type in remission: Secondary | ICD-10-CM | POA: Diagnosis not present

## 2016-03-17 DIAGNOSIS — R531 Weakness: Secondary | ICD-10-CM | POA: Diagnosis not present

## 2016-03-17 DIAGNOSIS — I129 Hypertensive chronic kidney disease with stage 1 through stage 4 chronic kidney disease, or unspecified chronic kidney disease: Secondary | ICD-10-CM | POA: Diagnosis not present

## 2016-03-19 DIAGNOSIS — R262 Difficulty in walking, not elsewhere classified: Secondary | ICD-10-CM | POA: Diagnosis not present

## 2016-03-19 DIAGNOSIS — D649 Anemia, unspecified: Secondary | ICD-10-CM | POA: Diagnosis not present

## 2016-03-19 DIAGNOSIS — N183 Chronic kidney disease, stage 3 (moderate): Secondary | ICD-10-CM | POA: Diagnosis not present

## 2016-03-19 DIAGNOSIS — J189 Pneumonia, unspecified organism: Secondary | ICD-10-CM | POA: Diagnosis not present

## 2016-04-11 DIAGNOSIS — E860 Dehydration: Secondary | ICD-10-CM | POA: Diagnosis not present

## 2016-04-11 DIAGNOSIS — N184 Chronic kidney disease, stage 4 (severe): Secondary | ICD-10-CM | POA: Diagnosis not present

## 2016-04-11 DIAGNOSIS — E039 Hypothyroidism, unspecified: Secondary | ICD-10-CM | POA: Diagnosis not present

## 2016-04-11 DIAGNOSIS — J181 Lobar pneumonia, unspecified organism: Secondary | ICD-10-CM | POA: Diagnosis not present

## 2016-04-14 DIAGNOSIS — H938X3 Other specified disorders of ear, bilateral: Secondary | ICD-10-CM | POA: Diagnosis not present

## 2016-04-14 DIAGNOSIS — H6591 Unspecified nonsuppurative otitis media, right ear: Secondary | ICD-10-CM | POA: Diagnosis not present

## 2016-04-17 DIAGNOSIS — K8020 Calculus of gallbladder without cholecystitis without obstruction: Secondary | ICD-10-CM | POA: Diagnosis not present

## 2016-04-24 DIAGNOSIS — K8020 Calculus of gallbladder without cholecystitis without obstruction: Secondary | ICD-10-CM | POA: Diagnosis not present

## 2016-05-03 DIAGNOSIS — N184 Chronic kidney disease, stage 4 (severe): Secondary | ICD-10-CM | POA: Diagnosis not present

## 2016-05-03 DIAGNOSIS — N179 Acute kidney failure, unspecified: Secondary | ICD-10-CM | POA: Diagnosis not present

## 2016-05-03 DIAGNOSIS — E559 Vitamin D deficiency, unspecified: Secondary | ICD-10-CM | POA: Diagnosis not present

## 2016-05-03 DIAGNOSIS — E2740 Unspecified adrenocortical insufficiency: Secondary | ICD-10-CM | POA: Diagnosis not present

## 2016-05-03 DIAGNOSIS — I10 Essential (primary) hypertension: Secondary | ICD-10-CM | POA: Diagnosis not present

## 2016-05-03 DIAGNOSIS — N183 Chronic kidney disease, stage 3 (moderate): Secondary | ICD-10-CM | POA: Diagnosis not present

## 2016-05-12 DIAGNOSIS — E860 Dehydration: Secondary | ICD-10-CM | POA: Diagnosis not present

## 2016-05-12 DIAGNOSIS — R410 Disorientation, unspecified: Secondary | ICD-10-CM | POA: Diagnosis not present

## 2016-05-12 DIAGNOSIS — N289 Disorder of kidney and ureter, unspecified: Secondary | ICD-10-CM | POA: Diagnosis not present

## 2016-05-12 DIAGNOSIS — S0990XA Unspecified injury of head, initial encounter: Secondary | ICD-10-CM | POA: Diagnosis not present

## 2016-05-12 DIAGNOSIS — S0003XA Contusion of scalp, initial encounter: Secondary | ICD-10-CM | POA: Diagnosis not present

## 2016-05-12 DIAGNOSIS — S299XXA Unspecified injury of thorax, initial encounter: Secondary | ICD-10-CM | POA: Diagnosis not present

## 2016-05-12 DIAGNOSIS — N179 Acute kidney failure, unspecified: Secondary | ICD-10-CM | POA: Diagnosis not present

## 2016-05-12 DIAGNOSIS — N189 Chronic kidney disease, unspecified: Secondary | ICD-10-CM | POA: Diagnosis not present

## 2016-05-12 DIAGNOSIS — R220 Localized swelling, mass and lump, head: Secondary | ICD-10-CM | POA: Diagnosis not present

## 2016-05-12 DIAGNOSIS — R55 Syncope and collapse: Secondary | ICD-10-CM | POA: Diagnosis not present

## 2016-05-12 DIAGNOSIS — I129 Hypertensive chronic kidney disease with stage 1 through stage 4 chronic kidney disease, or unspecified chronic kidney disease: Secondary | ICD-10-CM | POA: Diagnosis not present

## 2016-05-16 DIAGNOSIS — I10 Essential (primary) hypertension: Secondary | ICD-10-CM | POA: Diagnosis not present

## 2016-05-16 DIAGNOSIS — H9193 Unspecified hearing loss, bilateral: Secondary | ICD-10-CM | POA: Diagnosis not present

## 2016-05-16 DIAGNOSIS — H6591 Unspecified nonsuppurative otitis media, right ear: Secondary | ICD-10-CM | POA: Diagnosis not present

## 2016-05-16 DIAGNOSIS — H903 Sensorineural hearing loss, bilateral: Secondary | ICD-10-CM | POA: Diagnosis not present

## 2016-05-18 DIAGNOSIS — E860 Dehydration: Secondary | ICD-10-CM | POA: Diagnosis not present

## 2016-05-18 DIAGNOSIS — N184 Chronic kidney disease, stage 4 (severe): Secondary | ICD-10-CM | POA: Diagnosis not present

## 2016-05-18 DIAGNOSIS — R55 Syncope and collapse: Secondary | ICD-10-CM | POA: Diagnosis not present

## 2016-05-18 DIAGNOSIS — Z139 Encounter for screening, unspecified: Secondary | ICD-10-CM | POA: Diagnosis not present

## 2016-05-18 DIAGNOSIS — Z1389 Encounter for screening for other disorder: Secondary | ICD-10-CM | POA: Diagnosis not present

## 2016-05-18 DIAGNOSIS — Z9181 History of falling: Secondary | ICD-10-CM | POA: Diagnosis not present

## 2016-06-07 DIAGNOSIS — Z8546 Personal history of malignant neoplasm of prostate: Secondary | ICD-10-CM

## 2016-06-07 DIAGNOSIS — C61 Malignant neoplasm of prostate: Secondary | ICD-10-CM | POA: Diagnosis not present

## 2016-06-07 DIAGNOSIS — C8338 Diffuse large B-cell lymphoma, lymph nodes of multiple sites: Secondary | ICD-10-CM | POA: Diagnosis not present

## 2016-06-07 DIAGNOSIS — Z8572 Personal history of non-Hodgkin lymphomas: Secondary | ICD-10-CM | POA: Diagnosis not present

## 2016-06-07 DIAGNOSIS — N184 Chronic kidney disease, stage 4 (severe): Secondary | ICD-10-CM | POA: Diagnosis not present

## 2016-06-07 DIAGNOSIS — R5383 Other fatigue: Secondary | ICD-10-CM

## 2016-06-07 DIAGNOSIS — G629 Polyneuropathy, unspecified: Secondary | ICD-10-CM | POA: Diagnosis not present

## 2016-06-07 DIAGNOSIS — E273 Drug-induced adrenocortical insufficiency: Secondary | ICD-10-CM | POA: Diagnosis not present

## 2016-06-07 DIAGNOSIS — D649 Anemia, unspecified: Secondary | ICD-10-CM

## 2016-06-07 DIAGNOSIS — C9110 Chronic lymphocytic leukemia of B-cell type not having achieved remission: Secondary | ICD-10-CM | POA: Diagnosis not present

## 2016-06-07 DIAGNOSIS — G620 Drug-induced polyneuropathy: Secondary | ICD-10-CM | POA: Diagnosis not present

## 2016-06-07 DIAGNOSIS — E2740 Unspecified adrenocortical insufficiency: Secondary | ICD-10-CM

## 2016-06-07 DIAGNOSIS — R262 Difficulty in walking, not elsewhere classified: Secondary | ICD-10-CM | POA: Diagnosis not present

## 2016-06-07 DIAGNOSIS — Z6824 Body mass index (BMI) 24.0-24.9, adult: Secondary | ICD-10-CM | POA: Diagnosis not present

## 2016-07-07 DIAGNOSIS — Z23 Encounter for immunization: Secondary | ICD-10-CM | POA: Diagnosis not present

## 2016-07-29 DIAGNOSIS — J208 Acute bronchitis due to other specified organisms: Secondary | ICD-10-CM | POA: Diagnosis not present

## 2016-08-18 DEATH — deceased
